# Patient Record
Sex: Female | Born: 1990 | Race: White | Hispanic: No | Marital: Single | Smoking: Current every day smoker
Health system: Southern US, Community
[De-identification: ages and names within clinical notes are randomized; demographics above are authoritative.]

## PROBLEM LIST (undated history)

## (undated) DIAGNOSIS — J45909 Unspecified asthma, uncomplicated: Secondary | ICD-10-CM

## (undated) DIAGNOSIS — IMO0001 Reserved for inherently not codable concepts without codable children: Secondary | ICD-10-CM

## (undated) DIAGNOSIS — F419 Anxiety disorder, unspecified: Secondary | ICD-10-CM

## (undated) DIAGNOSIS — F32A Depression, unspecified: Secondary | ICD-10-CM

## (undated) DIAGNOSIS — L98499 Non-pressure chronic ulcer of skin of other sites with unspecified severity: Secondary | ICD-10-CM

## (undated) DIAGNOSIS — F4310 Post-traumatic stress disorder, unspecified: Secondary | ICD-10-CM

## (undated) DIAGNOSIS — F329 Major depressive disorder, single episode, unspecified: Secondary | ICD-10-CM

## (undated) HISTORY — DX: Post-traumatic stress disorder, unspecified: F43.10

## (undated) HISTORY — DX: Anxiety disorder, unspecified: F41.9

## (undated) HISTORY — DX: Depression, unspecified: F32.A

## (undated) HISTORY — DX: Major depressive disorder, single episode, unspecified: F32.9

---

## 2011-11-16 ENCOUNTER — Emergency Department (INDEPENDENT_AMBULATORY_CARE_PROVIDER_SITE_OTHER): Payer: Self-pay

## 2011-11-16 ENCOUNTER — Encounter: Payer: Self-pay | Admitting: *Deleted

## 2011-11-16 ENCOUNTER — Emergency Department (HOSPITAL_BASED_OUTPATIENT_CLINIC_OR_DEPARTMENT_OTHER)
Admission: EM | Admit: 2011-11-16 | Discharge: 2011-11-16 | Disposition: A | Discharge status: Home / Self Care | Payer: Self-pay | Attending: Emergency Medicine | Admitting: Emergency Medicine

## 2011-11-16 DIAGNOSIS — R10813 Right lower quadrant abdominal tenderness: Secondary | ICD-10-CM

## 2011-11-16 DIAGNOSIS — R112 Nausea with vomiting, unspecified: Secondary | ICD-10-CM | POA: Insufficient documentation

## 2011-11-16 DIAGNOSIS — R1031 Right lower quadrant pain: Secondary | ICD-10-CM | POA: Insufficient documentation

## 2011-11-16 DIAGNOSIS — F17200 Nicotine dependence, unspecified, uncomplicated: Secondary | ICD-10-CM | POA: Insufficient documentation

## 2011-11-16 LAB — DIFFERENTIAL
Basophils Absolute: 0 10*3/uL (ref 0.0–0.1)
Eosinophils Relative: 0 % (ref 0–5)
Lymphocytes Relative: 4 % — ABNORMAL LOW (ref 12–46)
Neutro Abs: 9.6 10*3/uL — ABNORMAL HIGH (ref 1.7–7.7)

## 2011-11-16 LAB — COMPREHENSIVE METABOLIC PANEL
ALT: <5 U/L (ref 0–35)
AST: 13 U/L (ref 0–37)
Albumin: 4.6 g/dL (ref 3.5–5.2)
CO2: 25 mEq/L (ref 19–32)
Calcium: 9.8 mg/dL (ref 8.4–10.5)
GFR calc non Af Amer: >90 mL/min (ref >90)
Sodium: 139 mEq/L (ref 135–145)

## 2011-11-16 LAB — URINALYSIS, ROUTINE W REFLEX MICROSCOPIC
Bilirubin Urine: NEGATIVE
Glucose, UA: NEGATIVE mg/dL
Hgb urine dipstick: NEGATIVE
Ketones, ur: 40 mg/dL — AB
pH: 8 (ref 5.0–8.0)

## 2011-11-16 LAB — CBC
MCV: 88 fL (ref 78.0–100.0)
Platelets: 306 10*3/uL (ref 150–400)
RDW: 15.3 % (ref 11.5–15.5)
WBC: 10.8 10*3/uL — ABNORMAL HIGH (ref 4.0–10.5)

## 2011-11-16 LAB — URINE MICROSCOPIC-ADD ON

## 2011-11-16 MED ORDER — ONDANSETRON 4 MG PO TBDP
4.0000 mg | ORAL_TABLET | Freq: Once | ORAL | Status: AC
  Administered 2011-11-16: 4 mg via ORAL
  Filled 2011-11-16: qty 1

## 2011-11-16 MED ORDER — IOHEXOL 300 MG/ML  SOLN
100.0000 mL | Freq: Once | INTRAMUSCULAR | Status: AC | PRN
  Administered 2011-11-16: 100 mL via INTRAVENOUS

## 2011-11-16 MED ORDER — ONDANSETRON HCL 4 MG/2ML IJ SOLN
4.0000 mg | Freq: Once | INTRAMUSCULAR | Status: AC
  Administered 2011-11-16: 4 mg via INTRAVENOUS
  Filled 2011-11-16: qty 2

## 2011-11-16 MED ORDER — SULFAMETHOXAZOLE-TRIMETHOPRIM 800-160 MG PO TABS: 1.0000 | ORAL_TABLET | Freq: Two times a day (BID) | ORAL | Status: AC

## 2011-11-16 MED ORDER — SODIUM CHLORIDE 0.9 % IV SOLN
Freq: Once | INTRAVENOUS | Status: AC
  Administered 2011-11-16: 999 mL via INTRAVENOUS

## 2011-11-16 MED ORDER — DEXTROSE 5 % IV SOLN
1.0000 g | Freq: Once | INTRAVENOUS | Status: AC
  Administered 2011-11-16: 1 g via INTRAVENOUS
  Filled 2011-11-16: qty 10

## 2011-11-16 MED ORDER — ONDANSETRON 8 MG PO TBDP: 8.0000 mg | ORAL_TABLET | Freq: Three times a day (TID) | ORAL | Status: DC | PRN

## 2011-11-16 MED ORDER — MORPHINE SULFATE 4 MG/ML IJ SOLN
4.0000 mg | Freq: Once | INTRAMUSCULAR | Status: AC
  Administered 2011-11-16: 4 mg via INTRAVENOUS
  Filled 2011-11-16 (×2): qty 1

## 2011-11-16 NOTE — ED Notes (Signed)
In to check on pt she is eating ice given by friend at bedside tolerated hasnt vomited since she has been here

## 2011-11-16 NOTE — ED Notes (Signed)
Awoke at 0300 this am vomiting states has vomited x 5 no diarrhea

## 2011-11-16 NOTE — ED Provider Notes (Addendum)
History     CSN: [id number] Arrival date & time: 11/16/2011 11:27 AM   First MD Initiated Contact with Patient 11/16/11 1252      Chief Complaint  Patient presents with  . Emesis    (Consider location/radiation/quality/duration/timing/severity/associated sxs/prior treatment) HPI  Patient with nausea and emesis began at 0300. Vomited 5-6 times.  Vomited drink first.  Patient drank alcohol last night.   She drink  History reviewed. No pertinent past medical history.  History reviewed. No pertinent past surgical history.  History reviewed. No pertinent family history.  History  Substance Use Topics  . Smoking status: Current Everyday Smoker  . Smokeless tobacco: Not on file  . Alcohol Use: Yes     heavy drinker    OB History    Grav Para Term Preterm Abortions TAB SAB Ect Mult Living                  Review of Systems  Allergies  Review of patient's allergies indicates no known allergies.  Home Medications  No current outpatient prescriptions on file.  BP 113/51  Pulse 96  Resp 20  SpO2 100%  LMP 11/03/2011  Physical Exam  ED Course  Procedures (including critical care time)  Labs Reviewed  URINALYSIS, ROUTINE W REFLEX MICROSCOPIC - Abnormal; Notable for the following:    APPearance CLOUDY (*)    Ketones, ur 40 (*)    Protein, ur 30 (*)    Nitrite POSITIVE (*)    Leukocytes, UA TRACE (*)    All other components within normal limits  URINE MICROSCOPIC-ADD ON - Abnormal; Notable for the following:    Squamous Epithelial / LPF FEW (*)    Bacteria, UA MANY (*)    All other components within normal limits  PREGNANCY, URINE   No results found.   No diagnosis found.    MDM  Ct Abdomen Pelvis W Contrast  11/16/2011  *RADIOLOGY REPORT*  Clinical Data: Right lower quadrant pain and tenderness.  CT ABDOMEN AND PELVIS WITH CONTRAST  Technique:  Multidetector CT imaging of the abdomen and pelvis was performed following the standard protocol during bolus  administration of intravenous contrast.  Contrast: OMNIPAQUE IOHEXOL 300 MG/ML IV SOLN  Comparison: None.  Findings: Lung bases are clear.  No pleural or pericardial fluid. The liver, gallbladder, spleen, pancreas, adrenal glands, aorta and IVC are normal.  No retroperitoneal mass or adenopathy.  The right kidney is normal except for a 6 mm cyst in the dorsal midportion. The left kidney is normal except for a 1 cm cyst in the dorsal midportion.  No evidence of mass, obstruction or stone disease.  The uterus and adnexal regions appear normal for a premenopausal female.  There is no specific ovarian pathology identifiable by CT.  Portions of the appendix are identified draping over the iliac vessels, best seen on the coronal and sagittal images.  There is no evidence of appendiceal inflammation.  IMPRESSION: No acute finding at this moment.  There is no CT evidence of acute appendicitis.  Clinical follow-up may be warranted in the setting of a mildly elevated white count.  Original Report Authenticated By: Thomasenia Sales, M.D.    Discussed with Dr. Karin Golden- Discussed results with patient and plan recheck tomorrow.         Hilario Quarry, MD 11/16/11 8657  Hilario Quarry, MD 11/16/11 [phone]

## 2011-11-16 NOTE — ED Notes (Signed)
During assessment pt states she drinks a lot of alcohol and drinks daily has no desire to stop but reports she drank 2 drinks of liquor and 2 40 ounce and one regular beer "at least " last night. Pt then woke up at 0300 vomiting . States " it may have something to do with the alcohol"

## 2011-11-18 ENCOUNTER — Encounter (HOSPITAL_BASED_OUTPATIENT_CLINIC_OR_DEPARTMENT_OTHER): Payer: Self-pay | Admitting: Family Medicine

## 2011-11-18 ENCOUNTER — Emergency Department (HOSPITAL_BASED_OUTPATIENT_CLINIC_OR_DEPARTMENT_OTHER)
Admission: EM | Admit: 2011-11-18 | Discharge: 2011-11-18 | Disposition: A | Discharge status: Home / Self Care | Payer: Self-pay | Attending: Emergency Medicine | Admitting: Emergency Medicine

## 2011-11-18 DIAGNOSIS — F17200 Nicotine dependence, unspecified, uncomplicated: Secondary | ICD-10-CM | POA: Insufficient documentation

## 2011-11-18 DIAGNOSIS — M79643 Pain in unspecified hand: Secondary | ICD-10-CM

## 2011-11-18 DIAGNOSIS — M79609 Pain in unspecified limb: Secondary | ICD-10-CM | POA: Insufficient documentation

## 2011-11-18 DIAGNOSIS — R110 Nausea: Secondary | ICD-10-CM | POA: Insufficient documentation

## 2011-11-18 NOTE — ED Notes (Addendum)
Pt sts she was seen here on Monday for nausea and back pain and "a CT was done for my appendix and they told me to come back yesterday to get it re-checked". Pt sts "I would also like to have my hand x-rayed because it's been hurting for 4 years".  Pt sts she could not get zofran prescription filled due to not having enough money. Pt sts she is taking antibiotic.

## 2011-11-18 NOTE — ED Provider Notes (Signed)
History     CSN: [id number] Arrival date & time: 11/18/2011  5:38 PM   First MD Initiated Contact with Patient 11/18/11 1834      Chief Complaint  Patient presents with  . Nausea   patient was seen in the emergency department one to 2 days ago for nausea and UTI. At that time. She did have a CAT scan, which did NOT show appendicitis. Patient presents requesting various things including a hand x-ray for chronic hand pain and states that she was told that her CAT scan was not sufficient. However, she is not having any abdominal pain. She does have some low back pain. She denies any discharge or bleeding, but it is noted that she did not have a pelvic exam on her initial examination. She's had no fevers.  (Consider location/radiation/quality/duration/timing/severity/associated sxs/prior treatment) HPI  History reviewed. No pertinent past medical history.  History reviewed. No pertinent past surgical history.  No family history on file.  History  Substance Use Topics  . Smoking status: Current Everyday Smoker  . Smokeless tobacco: Not on file  . Alcohol Use: Yes     heavy drinker    OB History    Grav Para Term Preterm Abortions TAB SAB Ect Mult Living                  Review of Systems  All other systems reviewed and are negative.    Allergies  Review of patient's allergies indicates no known allergies.  Home Medications   Current Outpatient Rx  Name Route Sig Dispense Refill  . VITAMIN D 1000 UNITS PO TABS Oral Take 1,000 Units by mouth daily.      Marland Kitchen FERROUS SULFATE 325 (65 FE) MG PO TBEC Oral Take 325 mg by mouth daily.      Marland Kitchen NAPROXEN SODIUM 220 MG PO TABS Oral Take 440 mg by mouth 2 (two) times daily with a meal.      . SULFAMETHOXAZOLE-TRIMETHOPRIM 800-160 MG PO TABS Oral Take 1 tablet by mouth every 12 (twelve) hours. 10 tablet 0  . ONDANSETRON 8 MG PO TBDP Oral Take 1 tablet (8 mg total) by mouth every 8 (eight) hours as needed for nausea. 20 tablet 0    BP  122/72  Pulse 78  Temp(Src) 97.7 F (36.5 C) (Oral)  Resp 16  Ht 5' (1.524 m)  Wt 100 lb (45.36 kg)  BMI 19.53 kg/m2  SpO2 100%  LMP 11/03/2011  Physical Exam  Nursing note and vitals reviewed. Constitutional: She is oriented to person, place, and time. She appears well-developed and well-nourished.  HENT:  Head: Normocephalic and atraumatic.  Eyes: Conjunctivae and EOM are normal. Pupils are equal, round, and reactive to light.  Neck: Neck supple.  Cardiovascular: Normal rate and regular rhythm.  Exam reveals no gallop and no friction rub.   No murmur heard. Pulmonary/Chest: Breath sounds normal. She has no wheezes. She has no rales. She exhibits no tenderness.  Abdominal: Soft. Bowel sounds are normal. She exhibits no distension. There is no tenderness. There is no rebound and no guarding.  Musculoskeletal: Normal range of motion. She exhibits no edema and no tenderness.       Patient's hands are examined. There is no sign of any redness, swelling, or any acute trauma.  Neurological: She is alert and oriented to person, place, and time. No cranial nerve deficit. Coordination normal.  Skin: Skin is warm and dry. No rash noted.  Psychiatric: She has a normal mood  and affect.    ED Course  Procedures (including critical care time)  Labs Reviewed - No data to display No results found.   No diagnosis found.    MDM  Pt is seen and examined;  Initial history and physical completed.  Will follow.    6:52 PM  Patient's previous records have been reviewed. CAT scan was reviewed. I recommended to the patient and a pelvic examination be done in regard to her previous symptoms. However, patient refuses pelvic exam at this time and states she started injected. A different facility. Otherwise, will be stable for discharge home and was told to continue her current medications      Kenneshia Rehm A. Patrica Duel, MD 11/18/11 [phone]

## 2012-03-24 ENCOUNTER — Encounter (HOSPITAL_BASED_OUTPATIENT_CLINIC_OR_DEPARTMENT_OTHER): Payer: Self-pay | Admitting: *Deleted

## 2012-03-24 ENCOUNTER — Ambulatory Visit (INDEPENDENT_AMBULATORY_CARE_PROVIDER_SITE_OTHER): Payer: Self-pay | Admitting: Advanced Practice Midwife

## 2012-03-24 ENCOUNTER — Encounter: Payer: Self-pay | Admitting: Advanced Practice Midwife

## 2012-03-24 ENCOUNTER — Emergency Department (HOSPITAL_BASED_OUTPATIENT_CLINIC_OR_DEPARTMENT_OTHER)
Admission: EM | Admit: 2012-03-24 | Discharge: 2012-03-24 | Disposition: A | Discharge status: Home / Self Care | Payer: Self-pay | Attending: Emergency Medicine | Admitting: Emergency Medicine

## 2012-03-24 VITALS — BP 128/81 | HR 92 | Temp 97.7°F | Ht 59.0 in | Wt 96.0 lb

## 2012-03-24 DIAGNOSIS — N920 Excessive and frequent menstruation with regular cycle: Secondary | ICD-10-CM | POA: Insufficient documentation

## 2012-03-24 DIAGNOSIS — F411 Generalized anxiety disorder: Secondary | ICD-10-CM | POA: Insufficient documentation

## 2012-03-24 DIAGNOSIS — N939 Abnormal uterine and vaginal bleeding, unspecified: Secondary | ICD-10-CM

## 2012-03-24 DIAGNOSIS — N898 Other specified noninflammatory disorders of vagina: Secondary | ICD-10-CM | POA: Insufficient documentation

## 2012-03-24 DIAGNOSIS — F1010 Alcohol abuse, uncomplicated: Secondary | ICD-10-CM

## 2012-03-24 LAB — CBC
HCT: 38.7 % (ref 36.0–46.0)
Hemoglobin: 12.7 g/dL (ref 12.0–15.0)
MCV: 92.4 fL (ref 78.0–100.0)
RBC: 4.19 MIL/uL (ref 3.87–5.11)
RDW: 13.4 % (ref 11.5–15.5)
WBC: 7.8 10*3/uL (ref 4.0–10.5)

## 2012-03-24 NOTE — Progress Notes (Signed)
States had ER visit today for heavy period that started 02/28/12,, states was heavy first two days, then got light , then got heavy again , even heavier than at beginning,  per chart review had negative upt today, states passed large clots. States has lost about 12 pounds recently because food stamps dc'd.

## 2012-03-24 NOTE — Discharge Instructions (Signed)

## 2012-03-24 NOTE — ED Notes (Signed)
Pt here for heavy vaginal bleeding x 1 month. Pt denies any pain or cramping.

## 2012-03-24 NOTE — ED Notes (Signed)
C/o vaginal bleeding for a month, periods are normally long and heavy but pt thinks this one is worse. Pt also states that she needs a note for court today since she will not be able to attend due to her bleeding.

## 2012-03-24 NOTE — ED Notes (Signed)
Pt did not have peripheral IV in place during this visit

## 2012-03-24 NOTE — ED Provider Notes (Addendum)
History     CSN: [id number]  Arrival date & time 03/24/12  0559   First MD Initiated Contact with Patient 03/24/12 [phone]      Chief Complaint  Patient presents with  . Vaginal Bleeding    (Consider location/radiation/quality/duration/timing/severity/associated sxs/prior treatment) HPI Is a 21 year old white female with a history of heavy menstrual periods she was 9. She has been bleeding vaginally for about a month now. Her bleeding is heavy. This is the longest period that she has had that was essentially. She is passing blood as well as clots. There is no associated abdominal pain or cramping. She is here this morning because she wants a note excusing her from court. She does say she is lightheaded on standing. She does not have an OB/GYN. There are no mitigating or exacerbating factors to the bleeding.  History reviewed. No pertinent past medical history.  History reviewed. No pertinent past surgical history.  History reviewed. No pertinent family history.  History  Substance Use Topics  . Smoking status: Current Everyday Smoker  . Smokeless tobacco: Not on file  . Alcohol Use: Yes     heavy drinker    OB History    Grav Para Term Preterm Abortions TAB SAB Ect Mult Living                  Review of Systems  All other systems reviewed and are negative.    Allergies  Review of patient's allergies indicates no known allergies.  Home Medications   Current Outpatient Rx  Name Route Sig Dispense Refill  . VITAMIN D 1000 UNITS PO TABS Oral Take 1,000 Units by mouth daily.      Marland Kitchen FERROUS SULFATE 325 (65 FE) MG PO TBEC Oral Take 325 mg by mouth daily.      Marland Kitchen NAPROXEN SODIUM 220 MG PO TABS Oral Take 440 mg by mouth 2 (two) times daily with a meal.        BP 123/89  Pulse 80  Temp(Src) 97.3 F (36.3 C) (Oral)  Resp 16  Ht 5' (1.524 m)  Wt 93 lb (42.185 kg)  BMI 18.16 kg/m2  SpO2 100%  Physical Exam General: Well-developed, well-nourished female in no acute  distress; appearance consistent with age of record HENT: normocephalic, atraumatic Eyes: pupils equal round and reactive to light; extraocular muscles intact; no conjunctival pallor Neck: supple Heart: regular rate and rhythm Lungs: clear to auscultation bilaterally Abdomen: soft; nondistended; nontender; no masses or hepatosplenomegaly GU: Normal external genitalia; moderate bleeding per cervical os with clots in vagina; no cervical motion tenderness Extremities: No deformity; full range of motion Neurologic: Awake, alert; motor function intact in all extremities and symmetric; no facial droop Skin: Warm and dry; no pallor Psychiatric: Anxious; tearful    ED Course  Procedures (including critical care time)   Nursing notes and vitals signs, including pulse oximetry, reviewed.  Summary of this visit's results, reviewed by myself:  Labs:  Results for orders placed during the hospital encounter of 03/24/12  PREGNANCY, URINE      Component Value Range   Preg Test, Ur NEGATIVE  NEGATIVE       MDM  [facility]        Hanley Seamen, MD 03/24/12 5621  Hanley Seamen, MD 03/24/12 3086

## 2012-03-24 NOTE — Patient Instructions (Signed)

## 2012-03-28 ENCOUNTER — Telehealth: Payer: Self-pay | Admitting: Obstetrics and Gynecology

## 2012-03-28 NOTE — Telephone Encounter (Signed)
Patient called stating she's returning our call in regard of test results. I see no records of previous call made to her for test results. I left message for patient to call us back to get more information.

## 2012-03-30 NOTE — Telephone Encounter (Signed)
Called patient, left message we are returning your call , sorry that we have missed you, I do see that you have an appointment later today- we can answer your questions then, call us if needed.

## 2012-03-30 NOTE — Progress Notes (Signed)
Patient ID: Shelly Baird, female   DOB: 07-04-1991, 21 y.o.   MRN: [id number]  Chief Complaint  Patient presents with  . Menorrhagia    HPI [name] is a 21 y.o. female.  HPI: Pt was referred to Gyn clinic from [facility] where she was seen for heavy vaginal bleeding and requesting a note for court. Pelvic exam performed showing moderate blood from w/ clots. No CMT. Neg UPT. Pt states she has had long, heavy monthly periods ever since menarche at  Age 88, but this period has lasted a month. It was heavy to start, lightened x several days, then became heavier than usual periods x several days. Pt is not very precise in her menstrual Hx. She declined to answer questions about sexual Hx, but states she doesn't believe in birth control. She does not think she is pregnant.  Past Medical History  Diagnosis Date  . Depression   . Anxiety   . PTSD (post-traumatic stress disorder)     History reviewed. No pertinent past surgical history.  History reviewed. No pertinent family history.  Social History History  Substance Use Topics  . Smoking status: Current Everyday Smoker -- 1.5 packs/day for 12 years  . Smokeless tobacco: Never Used  . Alcohol Use: Yes     heavy drinker    No Known Allergies  Current Outpatient Prescriptions  Medication Sig Dispense Refill  . Aspirin-Acetaminophen-Caffeine (EXCEDRIN EXTRA STRENGTH PO) Take 3 tablets by mouth as needed.      Marland Kitchen ibuprofen (ADVIL,MOTRIN) 200 MG tablet Take 200 mg by mouth every 6 (six) hours as needed.      . Naproxen Sodium (ALEVE PO) Take 3 tablets by mouth as needed.        Review of Systems Review of Systems  Constitutional: Negative for fever and chills.  Gastrointestinal: Negative for nausea, vomiting and abdominal pain.  Genitourinary: Positive for vaginal bleeding and menstrual problem. Negative for vaginal discharge and pelvic pain.  Skin: Negative for pallor.  Neurological: Negative for dizziness and syncope.    Hematological: Does not bruise/bleed easily.    Blood pressure 128/81, pulse 92, temperature 97.7 F (36.5 C), temperature source Oral, height 4\' 11"  (1.499 m), weight 96 lb (43.545 kg), last menstrual period 02/28/2012. Orthostatic VS  Lying  BP: 119/78 P: 82  Sitting  BP: 119/82 P: 90  Standing BP: 128/81 P: 92  Physical Exam Physical Exam  Nursing note and vitals reviewed. Constitutional: She is oriented to person, place, and time. Vital signs are normal. She appears well-developed and well-nourished. She appears cachectic. She is uncooperative. No distress.  Cardiovascular: Normal rate.   Pulmonary/Chest: Effort normal.  Abdominal: Soft. There is no tenderness.  Genitourinary:       Declined. States bleeding is the same as exam this morning in ED.  Neurological: She is alert and oriented to person, place, and time.  Skin: Skin is warm and dry. No pallor.  Psychiatric: Her mood appears anxious. Her affect is blunt and inappropriate. Her speech is rapid and/or pressured. She is agitated.   1. Menorrhagia with borderline orthostatic changes CBC, TSH   Discussed likely etiology is hormonal and usually managed w/ hormones. Other causes (structural, clotting disorder) much less likely. Pt strongly refuses hormones due to "not believing in birth control". Explained to pt that hormones, in this situation are therapeutic and not used for the purpose of birth control, but would have the effect also. Pt declines Tx at this time. Will call  clinic for Rx is she changes her mind.  Consider Pelvic US or coag work-up if hormonal Tx not effective. Bleeding precautions  Dorathy Kinsman 03/24/2012, 4:53 PM

## 2012-03-31 ENCOUNTER — Telehealth: Payer: Self-pay | Admitting: *Deleted

## 2012-03-31 NOTE — Telephone Encounter (Signed)
Telephoned home # left message to return call to clinic.

## 2012-03-31 NOTE — Telephone Encounter (Signed)
Called patient , first time was told by a female is wrong number, called two more times- sounded as though was answered , but no one spoke and no answering machine.

## 2012-03-31 NOTE — Telephone Encounter (Signed)
Patient called in and left another message stating she was here on 11th and someone said we would call her with results on Monday.  Patient states everytime I call you , you claim I've never been there and it is very frustrating. I have left several voicemails and you have left several vociemails and I have called a lady about 2 days ago and she was supposed to leave a note and call me- she is supposed to come back in 3 weeks for ultrasound. Please call me , I'll be at work .See telephone encounters- another staff member called patient and left a message this am.

## 2012-04-04 NOTE — Telephone Encounter (Signed)
Patient called back and advised all lab work was normal. Pt voiced understanding.

## 2012-04-12 ENCOUNTER — Emergency Department (HOSPITAL_BASED_OUTPATIENT_CLINIC_OR_DEPARTMENT_OTHER)
Admission: EM | Admit: 2012-04-12 | Discharge: 2012-04-12 | Disposition: A | Discharge status: Home / Self Care | Payer: Self-pay | Attending: Emergency Medicine | Admitting: Emergency Medicine

## 2012-04-12 ENCOUNTER — Encounter (HOSPITAL_BASED_OUTPATIENT_CLINIC_OR_DEPARTMENT_OTHER): Payer: Self-pay | Admitting: *Deleted

## 2012-04-12 DIAGNOSIS — F4310 Post-traumatic stress disorder, unspecified: Secondary | ICD-10-CM | POA: Insufficient documentation

## 2012-04-12 DIAGNOSIS — F411 Generalized anxiety disorder: Secondary | ICD-10-CM | POA: Insufficient documentation

## 2012-04-12 DIAGNOSIS — F3289 Other specified depressive episodes: Secondary | ICD-10-CM | POA: Insufficient documentation

## 2012-04-12 DIAGNOSIS — F329 Major depressive disorder, single episode, unspecified: Secondary | ICD-10-CM | POA: Insufficient documentation

## 2012-04-12 DIAGNOSIS — N898 Other specified noninflammatory disorders of vagina: Secondary | ICD-10-CM | POA: Insufficient documentation

## 2012-04-12 DIAGNOSIS — N939 Abnormal uterine and vaginal bleeding, unspecified: Secondary | ICD-10-CM

## 2012-04-12 DIAGNOSIS — F17200 Nicotine dependence, unspecified, uncomplicated: Secondary | ICD-10-CM | POA: Insufficient documentation

## 2012-04-12 LAB — CBC
HCT: 34.6 % — ABNORMAL LOW (ref 36.0–46.0)
Hemoglobin: 11.4 g/dL — ABNORMAL LOW (ref 12.0–15.0)
MCV: 91.8 fL (ref 78.0–100.0)
RBC: 3.77 MIL/uL — ABNORMAL LOW (ref 3.87–5.11)
WBC: 7.1 10*3/uL (ref 4.0–10.5)

## 2012-04-12 LAB — DIFFERENTIAL
Eosinophils Relative: 2 % (ref 0–5)
Lymphocytes Relative: 20 % (ref 12–46)
Lymphs Abs: 1.4 10*3/uL (ref 0.7–4.0)
Monocytes Absolute: 0.8 10*3/uL (ref 0.1–1.0)
Monocytes Relative: 11 % (ref 3–12)
Neutro Abs: 4.7 10*3/uL (ref 1.7–7.7)

## 2012-04-12 MED ORDER — MEDROXYPROGESTERONE ACETATE 5 MG PO TABS: 10.0000 mg | ORAL_TABLET | Freq: Every day | ORAL | Status: AC

## 2012-04-12 NOTE — ED Provider Notes (Signed)
History     CSN: [id number]  Arrival date & time 04/12/12  0272   First MD Initiated Contact with Patient 04/12/12 0830      Chief Complaint  Patient presents with  . Vaginal Bleeding    (Consider location/radiation/quality/duration/timing/severity/associated sxs/prior treatment) HPI Comments: Patient states vaginal bleeding for one month.  Was seen here and sent to gyn for the same.  She was seen at [facility] and offered hormone therapy which she refused.  She presents here complaining of feeling weak, dizzy, and having continued vaginal bleeding.  Patient is a 21 y.o. female presenting with vaginal bleeding. The history is provided by the patient.  Vaginal Bleeding This is a new problem. Episode onset: one month ago. The problem occurs constantly. The problem has not changed since onset.The symptoms are aggravated by nothing. The symptoms are relieved by nothing. She has tried nothing for the symptoms.    Past Medical History  Diagnosis Date  . Depression   . Anxiety   . PTSD (post-traumatic stress disorder)     History reviewed. No pertinent past surgical history.  No family history on file.  History  Substance Use Topics  . Smoking status: Current Everyday Smoker -- 1.5 packs/day for 12 years  . Smokeless tobacco: Never Used  . Alcohol Use: Yes     Occassionally     OB History    Grav Para Term Preterm Abortions TAB SAB Ect Mult Living   0 0 0 0 0 0 0 0 0 0       Review of Systems  Genitourinary: Positive for vaginal bleeding.  All other systems reviewed and are negative.    Allergies  Review of patient's allergies indicates no known allergies.  Home Medications   Current Outpatient Rx  Name Route Sig Dispense Refill  . EXCEDRIN EXTRA STRENGTH PO Oral Take 3 tablets by mouth as needed.    . IBUPROFEN 200 MG PO TABS Oral Take 200 mg by mouth every 6 (six) hours as needed.    . ALEVE PO Oral Take 3 tablets by mouth as needed.      BP 108/75  Pulse 81   Temp(Src) 98.1 F (36.7 C) (Oral)  Resp 20  Wt 99 lb 6 oz (45.076 kg)  SpO2 100%  LMP 02/28/2012  Physical Exam  Nursing note and vitals reviewed. Constitutional: She is oriented to person, place, and time. She appears well-developed and well-nourished. No distress.  HENT:  Head: Normocephalic and atraumatic.  Neck: Normal range of motion. Neck supple.  Cardiovascular: Normal rate and regular rhythm.   Pulmonary/Chest: Effort normal and breath sounds normal.  Abdominal: Soft. Bowel sounds are normal. She exhibits no distension. There is no tenderness.  Musculoskeletal: Normal range of motion.  Neurological: She is alert and oriented to person, place, and time.  Skin: Skin is dry. She is not diaphoretic.    ED Course  Procedures (including critical care time)   Labs Reviewed  CBC  DIFFERENTIAL  HCG, SERUM, QUALITATIVE   No results found.   No diagnosis found.    MDM  The Hb is slightly lower at 11.4 and the pregnancy test is negative.    She was seen by Gyn and refused hormonal replacement at that time.  She continues to refuse this as "I don't believe in birth control".  I explained to her again that she would be taking this for control of bleeding, not for contraception.  She continues to be apprehensive.  She will be given  a Rx for provera to fill if she changes her mind.  She needs to see Gyn in follow up.        Geoffery Lyons, MD 04/12/12 [phone]

## 2012-04-12 NOTE — Discharge Instructions (Signed)

## 2012-04-12 NOTE — ED Notes (Signed)
Pt refuses a pelvic exam by a female physician.

## 2012-04-12 NOTE — ED Notes (Signed)
Patient states she has had vaginal bleeding since February 28, 2012.  States the bleeding varies from light to heavy and is associated with pain in her lower abdomen and lower back.  States she was seen here this month for the same.

## 2012-04-14 ENCOUNTER — Encounter: Payer: Self-pay | Admitting: Advanced Practice Midwife

## 2012-04-28 ENCOUNTER — Ambulatory Visit: Payer: Self-pay | Admitting: Advanced Practice Midwife

## 2013-05-09 ENCOUNTER — Emergency Department (HOSPITAL_BASED_OUTPATIENT_CLINIC_OR_DEPARTMENT_OTHER): Payer: Self-pay

## 2013-05-09 ENCOUNTER — Emergency Department (HOSPITAL_BASED_OUTPATIENT_CLINIC_OR_DEPARTMENT_OTHER)
Admission: EM | Admit: 2013-05-09 | Discharge: 2013-05-09 | Discharge status: Against Medical Advice | Payer: Self-pay | Attending: Emergency Medicine | Admitting: Emergency Medicine

## 2013-05-09 ENCOUNTER — Encounter (HOSPITAL_BASED_OUTPATIENT_CLINIC_OR_DEPARTMENT_OTHER): Payer: Self-pay | Admitting: *Deleted

## 2013-05-09 DIAGNOSIS — F3289 Other specified depressive episodes: Secondary | ICD-10-CM | POA: Insufficient documentation

## 2013-05-09 DIAGNOSIS — Z951 Presence of aortocoronary bypass graft: Secondary | ICD-10-CM | POA: Insufficient documentation

## 2013-05-09 DIAGNOSIS — Z7982 Long term (current) use of aspirin: Secondary | ICD-10-CM | POA: Insufficient documentation

## 2013-05-09 DIAGNOSIS — R002 Palpitations: Secondary | ICD-10-CM

## 2013-05-09 DIAGNOSIS — F329 Major depressive disorder, single episode, unspecified: Secondary | ICD-10-CM | POA: Insufficient documentation

## 2013-05-09 DIAGNOSIS — F411 Generalized anxiety disorder: Secondary | ICD-10-CM | POA: Insufficient documentation

## 2013-05-09 DIAGNOSIS — J45909 Unspecified asthma, uncomplicated: Secondary | ICD-10-CM | POA: Insufficient documentation

## 2013-05-09 DIAGNOSIS — Z3202 Encounter for pregnancy test, result negative: Secondary | ICD-10-CM | POA: Insufficient documentation

## 2013-05-09 DIAGNOSIS — R51 Headache: Secondary | ICD-10-CM | POA: Insufficient documentation

## 2013-05-09 DIAGNOSIS — Z8719 Personal history of other diseases of the digestive system: Secondary | ICD-10-CM | POA: Insufficient documentation

## 2013-05-09 DIAGNOSIS — F17200 Nicotine dependence, unspecified, uncomplicated: Secondary | ICD-10-CM | POA: Insufficient documentation

## 2013-05-09 DIAGNOSIS — Z791 Long term (current) use of non-steroidal anti-inflammatories (NSAID): Secondary | ICD-10-CM | POA: Insufficient documentation

## 2013-05-09 DIAGNOSIS — Z79899 Other long term (current) drug therapy: Secondary | ICD-10-CM | POA: Insufficient documentation

## 2013-05-09 HISTORY — DX: Unspecified asthma, uncomplicated: J45.909

## 2013-05-09 HISTORY — DX: Reserved for inherently not codable concepts without codable children: IMO0001

## 2013-05-09 HISTORY — DX: Non-pressure chronic ulcer of skin of other sites with unspecified severity: L98.499

## 2013-05-09 LAB — URINALYSIS, ROUTINE W REFLEX MICROSCOPIC
Hgb urine dipstick: NEGATIVE
Nitrite: NEGATIVE
Specific Gravity, Urine: 1.027 (ref 1.005–1.030)
Urobilinogen, UA: 0.2 mg/dL (ref 0.0–1.0)

## 2013-05-09 LAB — POCT I-STAT TROPONIN I: Troponin i, poc: 0 ng/mL (ref 0.00–0.08)

## 2013-05-09 LAB — PREGNANCY, URINE: Preg Test, Ur: NEGATIVE

## 2013-05-09 LAB — CBC WITH DIFFERENTIAL/PLATELET
Basophils Absolute: 0 10*3/uL (ref 0.0–0.1)
Basophils Relative: 0 % (ref 0–1)
Eosinophils Absolute: 0 10*3/uL (ref 0.0–0.7)
Eosinophils Relative: 0 % (ref 0–5)
HCT: 41.2 % (ref 36.0–46.0)
MCH: 30.4 pg (ref 26.0–34.0)
MCHC: 35 g/dL (ref 30.0–36.0)
MCV: 87.1 fL (ref 78.0–100.0)
Monocytes Absolute: 1 10*3/uL (ref 0.1–1.0)
RDW: 14 % (ref 11.5–15.5)

## 2013-05-09 LAB — COMPREHENSIVE METABOLIC PANEL
AST: 16 U/L (ref 0–37)
Albumin: 4.3 g/dL (ref 3.5–5.2)
CO2: 20 mEq/L (ref 19–32)
Calcium: 9.9 mg/dL (ref 8.4–10.5)
Creatinine, Ser: 0.7 mg/dL (ref 0.50–1.10)
GFR calc non Af Amer: >90 mL/min (ref >90)

## 2013-05-09 LAB — URINE MICROSCOPIC-ADD ON

## 2013-05-09 MED ORDER — KETOROLAC TROMETHAMINE 30 MG/ML IJ SOLN
30.0000 mg | Freq: Once | INTRAMUSCULAR | Status: AC
  Administered 2013-05-09: 30 mg via INTRAVENOUS
  Filled 2013-05-09: qty 1

## 2013-05-09 MED ORDER — METOCLOPRAMIDE HCL 5 MG/ML IJ SOLN
10.0000 mg | Freq: Once | INTRAMUSCULAR | Status: AC
  Administered 2013-05-09: 10 mg via INTRAVENOUS
  Filled 2013-05-09: qty 2

## 2013-05-09 MED ORDER — DIPHENHYDRAMINE HCL 50 MG/ML IJ SOLN
12.5000 mg | Freq: Once | INTRAMUSCULAR | Status: AC
  Administered 2013-05-09: 12.5 mg via INTRAVENOUS
  Filled 2013-05-09: qty 1

## 2013-05-09 NOTE — ED Notes (Signed)
Teressa Lower, FNP informed of pt disposition

## 2013-05-09 NOTE — ED Provider Notes (Signed)
History/physical exam/procedure(s) were performed by non-physician practitioner and as supervising physician I was immediately available for consultation/collaboration. I have reviewed all notes and am in agreement with care and plan.   Hilario Quarry, MD 05/09/13 [phone]

## 2013-05-09 NOTE — ED Notes (Signed)
Patient states she developed low hemoglobin in Aug 2013 due to menstrual bleeding, requiring a blood transfusion at [facility].  States since, she has had daily heart palpitations, sob and migraines.  States she sees a Dr. Zane Herald, OBGYN, in [place], [place] and is scheduled for fitting for home heart monitor to assess her current condition next Tuesday.  States today, she progressively has had increased palpatitations,   Headache and muscle stiffness.

## 2013-05-09 NOTE — ED Provider Notes (Signed)
History     CSN: [id number]  Arrival date & time 05/09/13  1152   First MD Initiated Contact with Patient 05/09/13 1231      Chief Complaint  Patient presents with  . Palpitations    (Consider location/radiation/quality/duration/timing/severity/associated sxs/prior treatment) HPI Comments: Patient states she developed low hemoglobin in Aug 2013 due to menstrual bleeding, requiring a blood transfusion at [facility].  States since, she has had daily heart palpitations, sob and migraines. Pt states that she is supposed to be seen for all these symptoms at a medicine clinic in [place] and her ob office set her up:pt states that she is having mild bleeding but it has cut back tremendously since getting a depo shot in the last month  The history is provided by the patient.    Past Medical History  Diagnosis Date  . Depression   . Anxiety   . PTSD (post-traumatic stress disorder)   . Asthma   . Ulcer disease     History reviewed. No pertinent past surgical history.  No family history on file.  History  Substance Use Topics  . Smoking status: Current Every Day Smoker -- 0.50 packs/day for 12 years    Types: Cigarettes  . Smokeless tobacco: Never Used  . Alcohol Use: Yes     Comment: Occassionally     OB History   Grav Para Term Preterm Abortions TAB SAB Ect Mult Living   0 0 0 0 0 0 0 0 0 0       Review of Systems  Constitutional: Negative.   Respiratory: Negative.   Cardiovascular: Negative.     Allergies  Review of patient's allergies indicates no known allergies.  Home Medications   Current Outpatient Rx  Name  Route  Sig  Dispense  Refill  . acetaminophen (TYLENOL) 325 MG tablet   Oral   Take 650 mg by mouth every 6 (six) hours as needed for pain.         . Aspirin-Acetaminophen-Caffeine (EXCEDRIN EXTRA STRENGTH PO)   Oral   Take 3 tablets by mouth as needed.         Marland Kitchen ibuprofen (ADVIL,MOTRIN) 200 MG tablet   Oral   Take 200 mg by mouth every 6 (six)  hours as needed.         Marland Kitchen EXPIRED: medroxyPROGESTERone (PROVERA) 5 MG tablet   Oral   Take 2 tablets (10 mg total) by mouth daily.   5 tablet   0   . Naproxen Sodium (ALEVE PO)   Oral   Take 3 tablets by mouth as needed.           BP 115/75  Pulse 94  Temp(Src) 98.3 F (36.8 C) (Oral)  Resp 18  Ht 4\' 11"  (1.499 m)  Wt 111 lb (50.349 kg)  BMI 22.41 kg/m2  SpO2 100%  LMP 04/09/2013  Physical Exam  Nursing note and vitals reviewed. Constitutional: She is oriented to person, place, and time. She appears well-developed and well-nourished.  HENT:  Head: Normocephalic and atraumatic.  Eyes: Conjunctivae and EOM are normal.  Neck: Normal range of motion. Neck supple.  Cardiovascular: Normal rate and regular rhythm.   Pulmonary/Chest: Effort normal and breath sounds normal.  Abdominal: Soft. Bowel sounds are normal. There is no tenderness.  Musculoskeletal: Normal range of motion.  Neurological: She is alert and oriented to person, place, and time.  Skin: Skin is warm and dry.  Psychiatric: She has a normal mood and affect.  ED Course  Procedures (including critical care time)  Labs Reviewed  CBC WITH DIFFERENTIAL - Abnormal; Notable for the following:    WBC 10.7 (*)    All other components within normal limits  COMPREHENSIVE METABOLIC PANEL - Abnormal; Notable for the following:    Potassium 3.4 (*)    All other components within normal limits  URINALYSIS, ROUTINE W REFLEX MICROSCOPIC - Abnormal; Notable for the following:    APPearance TURBID (*)    Ketones, ur 40 (*)    Protein, ur 30 (*)    All other components within normal limits  PREGNANCY, URINE  URINE MICROSCOPIC-ADD ON  POCT I-STAT TROPONIN I   No results found.  Date: 05/09/2013  Rate: 89  Rhythm: normal sinus rhythm  QRS Axis: normal  Intervals: normal  ST/T Wave abnormalities: normal  Conduction Disutrbances:none  Narrative Interpretation:   Old EKG Reviewed: none available    1.  Headache   2. Palpitations       MDM  Pt was given medication for a headache and when the nursing staff went back in the room the pt was gone         Teressa Lower, NP 05/09/13 1328

## 2013-05-09 NOTE — ED Notes (Signed)
Pt returned from XR and wanting to leave the ED.  When asked why she wanted to leave her response is "I just don't want to be here and ready to go".  Pt signed AMA prior to leaving.

## 2014-08-15 IMAGING — CR DG CHEST 2V
2 series · 2 of 2 positions shown · non-contrast
Comparison: None.

CLINICAL DATA: Palpitations, headache, asthma, smoker

CHEST - 2 VIEW

[w chest pa]
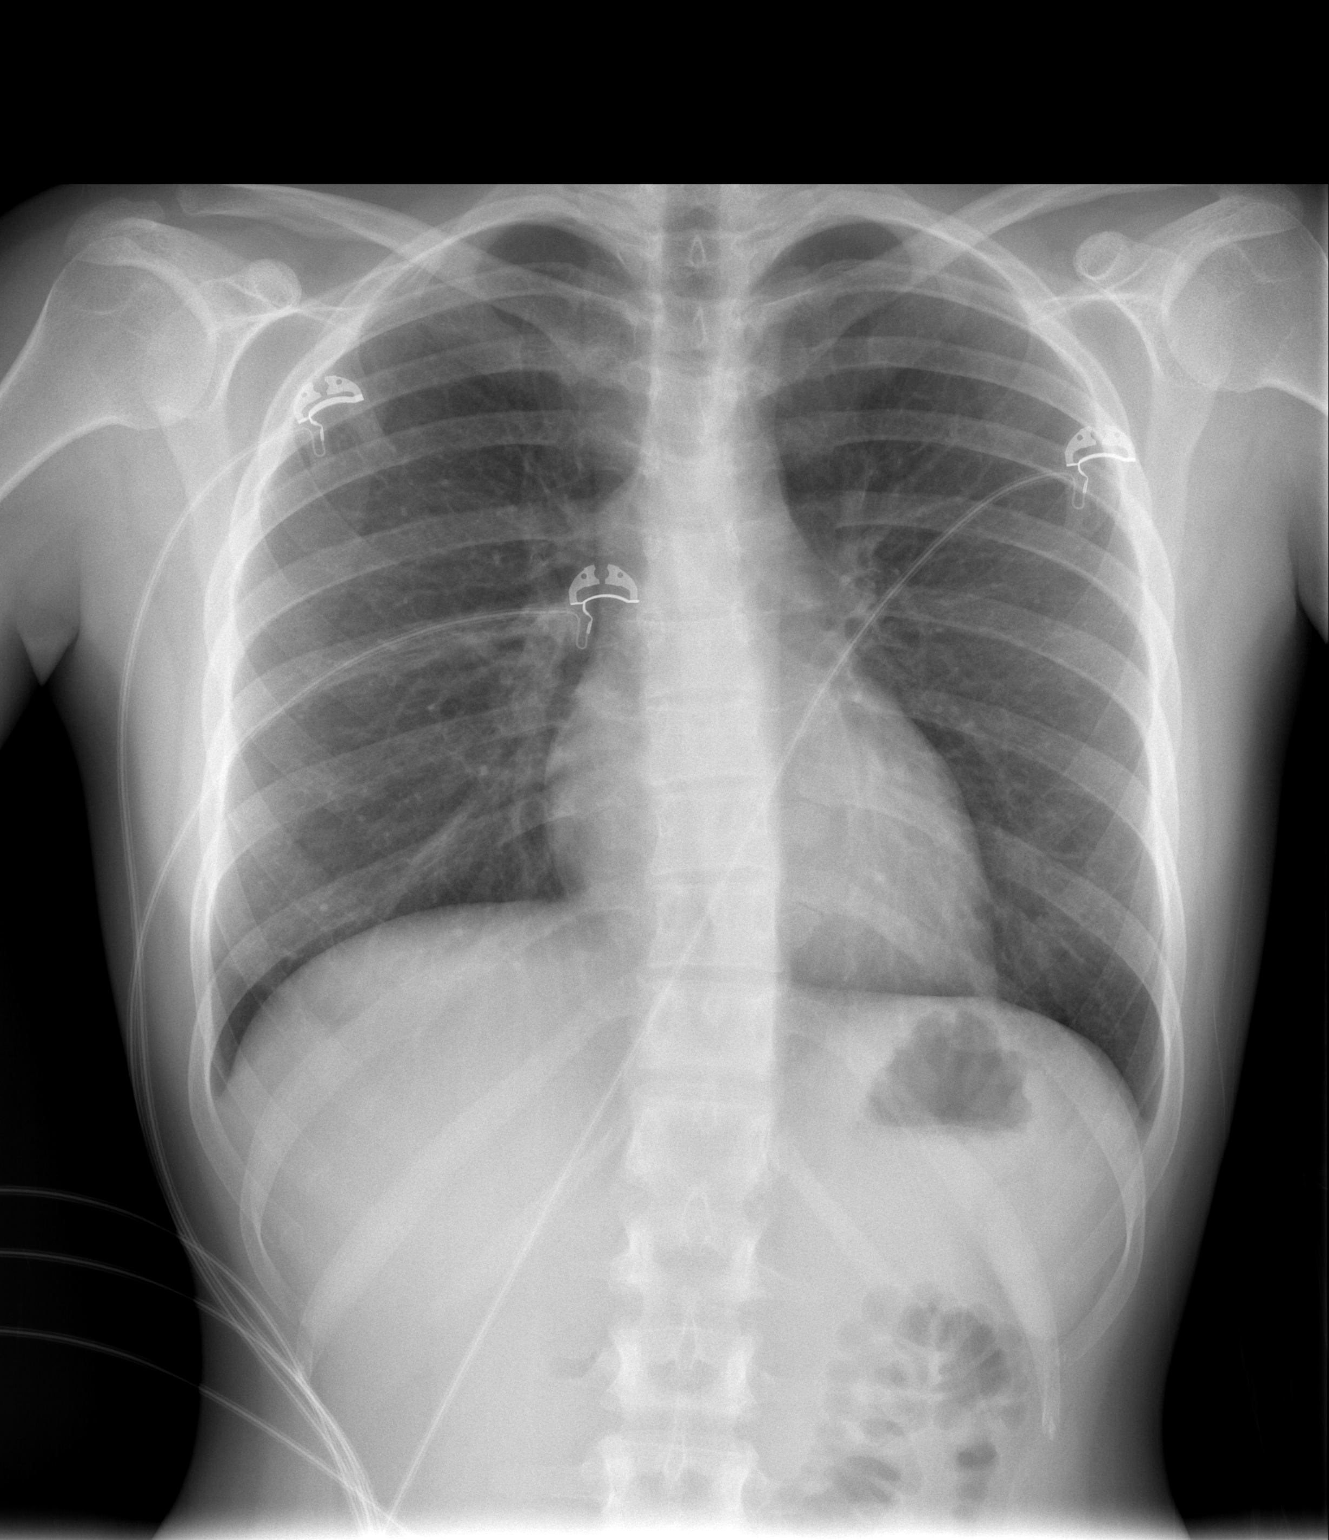

[w chest lat]
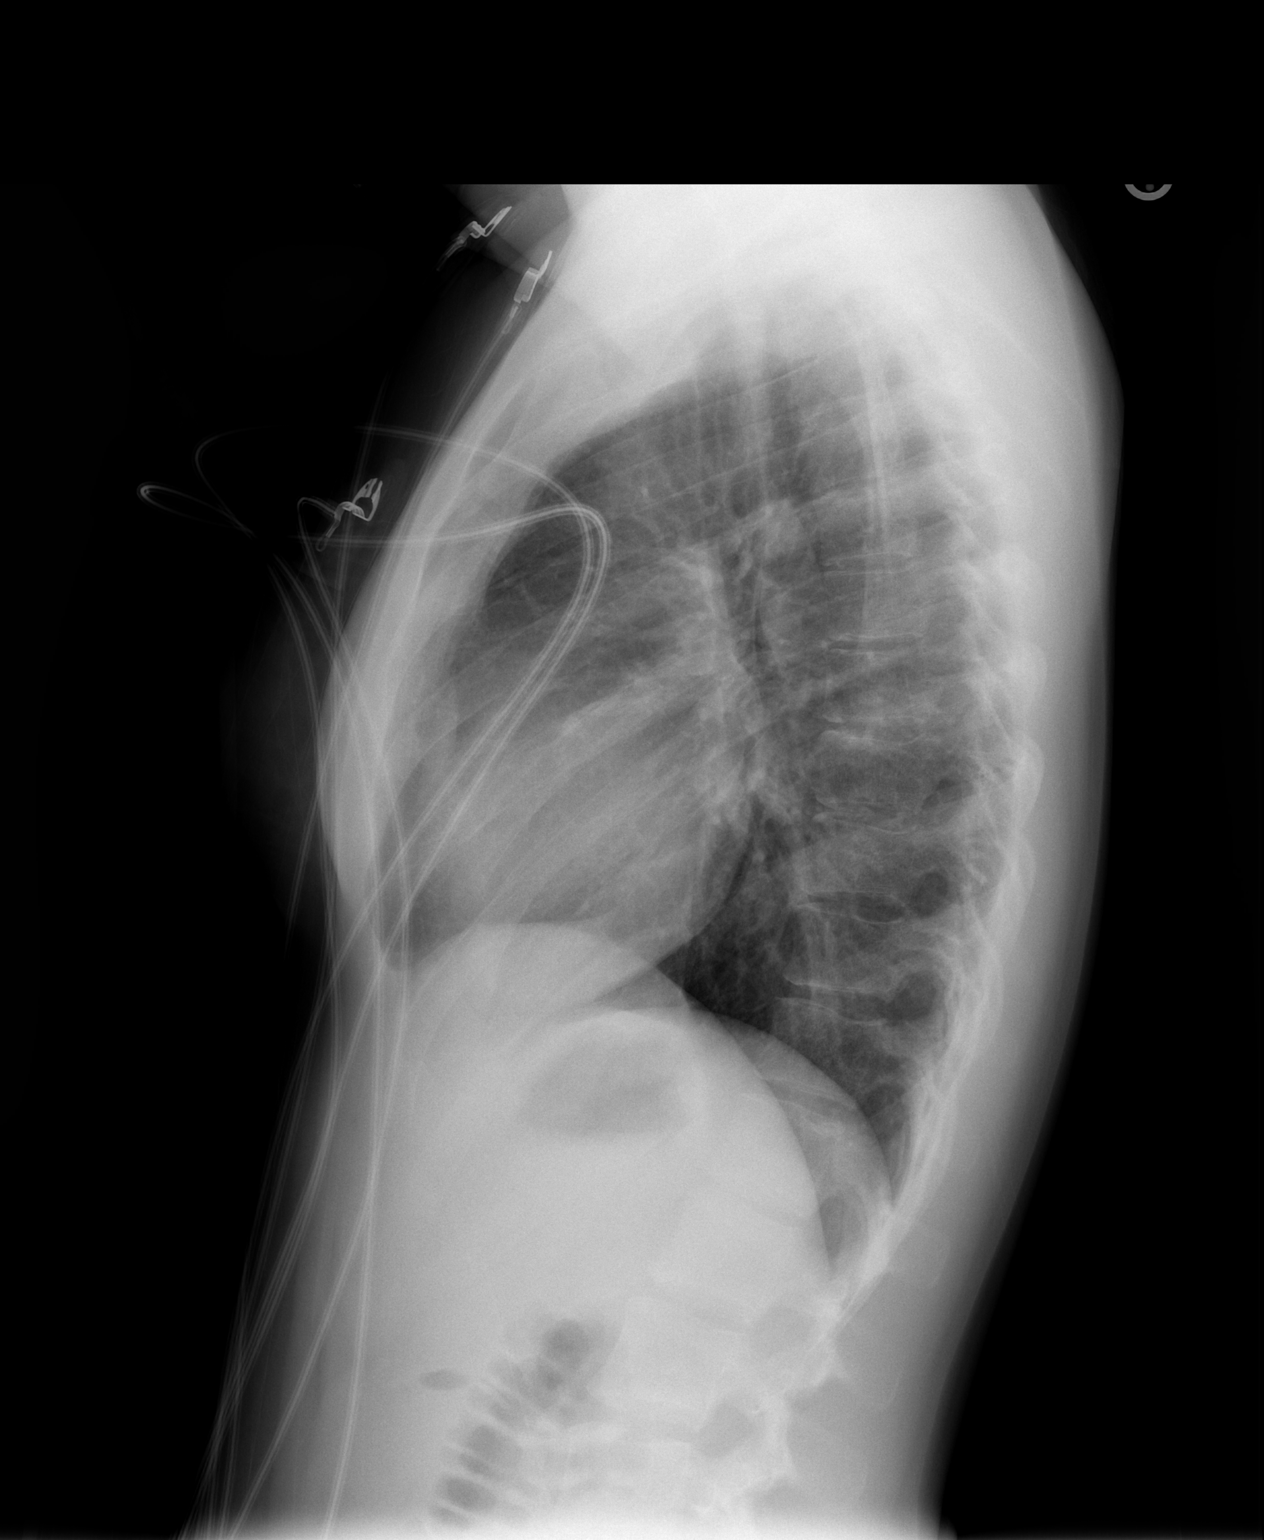

[2 of 2 positions shown; findings below may reference images not displayed]

FINDINGS: The heart size and mediastinal contours are within
normal limits.  Both lungs are clear.  The visualized skeletal
structures are unremarkable.
IMPRESSION: No active cardiopulmonary disease.

## 2015-03-28 ENCOUNTER — Encounter (HOSPITAL_COMMUNITY): Payer: Self-pay | Admitting: *Deleted

## 2015-03-28 ENCOUNTER — Emergency Department (HOSPITAL_COMMUNITY)
Admission: EM | Admit: 2015-03-28 | Discharge: 2015-03-28 | Disposition: A | Discharge status: Home / Self Care | Payer: Self-pay | Attending: Emergency Medicine | Admitting: Emergency Medicine

## 2015-03-28 ENCOUNTER — Emergency Department (HOSPITAL_COMMUNITY): Payer: Self-pay

## 2015-03-28 DIAGNOSIS — M7062 Trochanteric bursitis, left hip: Secondary | ICD-10-CM | POA: Insufficient documentation

## 2015-03-28 DIAGNOSIS — Z79899 Other long term (current) drug therapy: Secondary | ICD-10-CM | POA: Insufficient documentation

## 2015-03-28 DIAGNOSIS — Z8659 Personal history of other mental and behavioral disorders: Secondary | ICD-10-CM | POA: Insufficient documentation

## 2015-03-28 DIAGNOSIS — Y9389 Activity, other specified: Secondary | ICD-10-CM | POA: Insufficient documentation

## 2015-03-28 DIAGNOSIS — Z3202 Encounter for pregnancy test, result negative: Secondary | ICD-10-CM | POA: Insufficient documentation

## 2015-03-28 DIAGNOSIS — J45909 Unspecified asthma, uncomplicated: Secondary | ICD-10-CM | POA: Insufficient documentation

## 2015-03-28 DIAGNOSIS — Z720 Tobacco use: Secondary | ICD-10-CM | POA: Insufficient documentation

## 2015-03-28 DIAGNOSIS — R52 Pain, unspecified: Secondary | ICD-10-CM

## 2015-03-28 LAB — POC URINE PREG, ED: PREG TEST UR: NEGATIVE

## 2015-03-28 MED ORDER — HYDROCODONE-ACETAMINOPHEN 5-325 MG PO TABS: 1.0000 | ORAL_TABLET | ORAL | Status: AC | PRN

## 2015-03-28 MED ORDER — PREDNISONE 50 MG PO TABS
60.0000 mg | ORAL_TABLET | Freq: Once | ORAL | Status: AC
  Administered 2015-03-28: 60 mg via ORAL
  Filled 2015-03-28 (×2): qty 1

## 2015-03-28 MED ORDER — PREDNISONE 10 MG PO TABS: ORAL_TABLET | ORAL | Status: AC

## 2015-03-28 MED ORDER — TRAMADOL HCL 50 MG PO TABS
50.0000 mg | ORAL_TABLET | Freq: Once | ORAL | Status: AC
  Administered 2015-03-28: 50 mg via ORAL
  Filled 2015-03-28: qty 1

## 2015-03-28 MED ORDER — HYDROCODONE-ACETAMINOPHEN 5-325 MG PO TABS
1.0000 | ORAL_TABLET | Freq: Once | ORAL | Status: AC
  Administered 2015-03-28: 1 via ORAL
  Filled 2015-03-28: qty 1

## 2015-03-28 NOTE — ED Notes (Addendum)
Pt sitting up in wheelchair, playing on phone, states that the pain medication has not helped, Tres ArroyosJulie PA notified,

## 2015-03-28 NOTE — ED Notes (Signed)
Julie PA at bedside,  

## 2015-03-28 NOTE — ED Notes (Signed)
Pain lt hip for 9 years, No known cause.  Worse in last 2 weeks. Says she cannot walk and had to leave work

## 2015-03-28 NOTE — ED Notes (Signed)
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

## 2015-03-28 NOTE — Discharge Instructions (Signed)
Bursitis Bursitis is a swelling and soreness (inflammation) of a fluid-filled sac (bursa) that overlies and protects a joint. It can be caused by injury, overuse of the joint, arthritis or infection. The joints most likely to be affected are the elbows, shoulders, hips and knees. HOME CARE INSTRUCTIONS   Apply heat to your painful thigh 20 minutes several times daily.  Rest the injured joint as much as possible, but continue to put the joint through a full range of motion, 4 times per day. (The shoulder joint especially becomes rapidly "frozen" if not used.) When the pain lessens, begin normal slow movements and usual activities.  Only take over-the-counter or prescription medicines for pain, discomfort or fever as directed by your caregiver.  Your caregiver may recommend draining the bursa and injecting medicine into the bursa. This may help the healing process.  Follow all instructions for follow-up with your caregiver. This includes any orthopedic referrals, physical therapy and rehabilitation. Any delay in obtaining necessary care could result in a delay or failure of the bursitis to heal and chronic pain. SEEK IMMEDIATE MEDICAL CARE IF:   Your pain increases even during treatment.  You develop an oral temperature above 102 F (38.9 C) and have heat and inflammation over the involved bursa. MAKE SURE YOU:   Understand these instructions.  Will watch your condition.  Will get help right away if you are not doing well or get worse. Document Released: 11/27/2000 Document Revised: 02/22/2012 Document Reviewed: 02/19/2014 [facility] Patient Information 2015 [place], [place]. This information is not intended to replace advice given to you by your health care provider. Make sure you discuss any questions you have with your health care provider.

## 2015-03-30 NOTE — ED Provider Notes (Signed)
CSN: [id number]     Arrival date & time 03/28/15  1821 History   First MD Initiated Contact with Patient 03/28/15 1922     Chief Complaint  Patient presents with  . Hip Pain     (Consider location/radiation/quality/duration/timing/severity/associated sxs/prior Treatment) The history is provided by the patient.   Shelly Baird is a 24 y.o. female presenting with acute left sided hip pain.  She reports persistent intermittent pain at this site for at least the past 9 years. She denies any injury but is currently working a job requiring her to stand for 8 hours.  There is radiation of pain to her left lateral mid thigh.  She has taken tylenol, excedrin and ibuprofen without relief of symptoms. Denies fevers, chills, rash, swelling, peripheral weakness or numbness.     Past Medical History  Diagnosis Date  . Depression   . Anxiety   . PTSD (post-traumatic stress disorder)   . Asthma   . Ulcer disease    History reviewed. No pertinent past surgical history. History reviewed. No pertinent family history. History  Substance Use Topics  . Smoking status: Current Every Day Smoker -- 0.50 packs/day for 12 years    Types: Cigarettes  . Smokeless tobacco: Never Used  . Alcohol Use: Yes     Comment: Occassionally    OB History    Gravida Para Term Preterm AB TAB SAB Ectopic Multiple Living       Review of Systems  Constitutional: Negative for fever.  Musculoskeletal: Positive for arthralgias. Negative for myalgias and joint swelling.  Neurological: Negative for weakness and numbness.      Allergies  Review of patient's allergies indicates no known allergies.  Home Medications   Prior to Admission medications   Medication Sig Start Date End Date Taking? Authorizing Provider  acetaminophen (TYLENOL) 325 MG tablet Take 650 mg by mouth every 6 (six) hours as needed for pain.    Historical Provider, MD  Aspirin-Acetaminophen-Caffeine (EXCEDRIN EXTRA STRENGTH PO)  Take 3 tablets by mouth daily as needed (pain).     Historical Provider, MD  HYDROcodone-acetaminophen (NORCO/VICODIN) 5-325 MG per tablet Take 1 tablet by mouth every 4 (four) hours as needed. 03/28/15   Burgess Amor, PA-C  ibuprofen (ADVIL,MOTRIN) 200 MG tablet Take 200 mg by mouth every 6 (six) hours as needed for mild pain.     Historical Provider, MD  medroxyPROGESTERone (PROVERA) 5 MG tablet Take 2 tablets (10 mg total) by mouth daily. Patient not taking: Reported on 03/28/2015 04/12/12 04/12/13  Geoffery Lyons, MD  predniSONE (DELTASONE) 10 MG tablet 6, 5, 4, 3, 2 then 1 tablet by mouth daily for 6 days total. 03/28/15   Burgess Amor, PA-C   BP 102/64 mmHg  Pulse 62  Temp(Src) 97.5 F (36.4 C) (Oral)  Resp 20  Ht  (1.499 m)  Wt 108 lb (48.988 kg)  BMI 21.80 kg/m2  SpO2 100%  LMP 03/19/2015 Physical Exam  Constitutional: She appears well-developed and well-nourished.  HENT:  Head: Atraumatic.  Neck: Normal range of motion.  Cardiovascular:  Pulses equal bilaterally  Musculoskeletal: She exhibits tenderness.       Left hip: She exhibits bony tenderness. She exhibits normal range of motion, normal strength, no swelling and no crepitus.  ttp over left greater trochanter.  No edema.    Neurological: She is alert. She has normal strength. She displays normal reflexes. No sensory deficit.  Skin: Skin  is warm and dry.  Psychiatric: She has a normal mood and affect.    ED Course  Procedures (including critical care time) Labs Review Labs Reviewed  POC URINE PREG, ED    Imaging Review Dg Hip Unilat With Pelvis 2-3 Views Left  03/28/2015   CLINICAL DATA:  Left hip pain, progressive, increased in severity this evening. Pain with ambulation.  EXAM: LEFT HIP (WITH PELVIS) 2-3 VIEWS  COMPARISON:  01/24/2014  FINDINGS: The cortical margins of the bony pelvis and left hip are intact. No fracture. Pubic symphysis and sacroiliac joints are congruent. Both femoral heads are well-seated in the  respective acetabula.  IMPRESSION: Normal radiographs of the pelvis and left hip.   Electronically Signed   By: Rubye OaksMelanie  Ehinger M.D.   On: 03/28/2015 21:37     EKG Interpretation None      MDM   Final diagnoses:  Pain  Greater trochanteric bursitis, left    Patients labs and/or radiological studies were reviewed and considered during the medical decision making and disposition process.  Results were also discussed with patient. Pt was encouraged to use heat tx, prednisone taper prescribed, few hydrocodone given after given tramadol in ed without pain relief.  Referral given for establishing pcp.  The patient appears reasonably screened and/or stabilized for discharge and I doubt any other medical condition or other [facility] requiring further screening, evaluation, or treatment in the ED at this time prior to discharge.   [place] controlled substance database reviewed.     Burgess AmorJulie Deborrah Mabin, PA-C 03/30/15 1142  Lorre NickAnthony Allen, MD 04/01/15 [phone]
